# Patient Record
Sex: Male | Born: 1990 | Race: Black or African American | Hispanic: No | Smoking: Current every day smoker
Health system: Southern US, Community
[De-identification: ages and names within clinical notes are randomized; demographics above are authoritative.]

## PROBLEM LIST (undated history)

## (undated) DIAGNOSIS — F319 Bipolar disorder, unspecified: Secondary | ICD-10-CM

## (undated) HISTORY — PX: TONSILLECTOMY: SUR1361

---

## 2014-10-11 ENCOUNTER — Emergency Department: Payer: Self-pay | Admitting: Emergency Medicine

## 2015-01-20 ENCOUNTER — Emergency Department
Admission: EM | Admit: 2015-01-20 | Discharge: 2015-01-21 | Disposition: A | Discharge status: Home / Self Care | Payer: Self-pay

## 2015-05-07 ENCOUNTER — Emergency Department
Admission: EM | Admit: 2015-05-07 | Discharge: 2015-05-07 | Disposition: A | Discharge status: Home / Self Care | Payer: Self-pay | Attending: Emergency Medicine | Admitting: Emergency Medicine

## 2015-05-07 ENCOUNTER — Encounter: Payer: Self-pay | Admitting: Emergency Medicine

## 2015-05-07 DIAGNOSIS — S6991XD Unspecified injury of right wrist, hand and finger(s), subsequent encounter: Secondary | ICD-10-CM | POA: Insufficient documentation

## 2015-05-07 DIAGNOSIS — S39012D Strain of muscle, fascia and tendon of lower back, subsequent encounter: Secondary | ICD-10-CM | POA: Insufficient documentation

## 2015-05-07 DIAGNOSIS — Z880 Allergy status to penicillin: Secondary | ICD-10-CM | POA: Insufficient documentation

## 2015-05-07 DIAGNOSIS — S161XXD Strain of muscle, fascia and tendon at neck level, subsequent encounter: Secondary | ICD-10-CM | POA: Insufficient documentation

## 2015-05-07 DIAGNOSIS — Z720 Tobacco use: Secondary | ICD-10-CM | POA: Insufficient documentation

## 2015-05-07 MED ORDER — IBUPROFEN 800 MG PO TABS: 800.0000 mg | ORAL_TABLET | Freq: Three times a day (TID) | ORAL | Status: DC | PRN

## 2015-05-07 MED ORDER — TRAMADOL HCL 50 MG PO TABS
50.0000 mg | ORAL_TABLET | Freq: Once | ORAL | Status: AC
  Administered 2015-05-07: 50 mg via ORAL
  Filled 2015-05-07: qty 1

## 2015-05-07 MED ORDER — TRAMADOL HCL 50 MG PO TABS: 50.0000 mg | ORAL_TABLET | Freq: Four times a day (QID) | ORAL | Status: DC | PRN

## 2015-05-07 MED ORDER — CYCLOBENZAPRINE HCL 10 MG PO TABS
10.0000 mg | ORAL_TABLET | Freq: Once | ORAL | Status: AC
  Administered 2015-05-07: 10 mg via ORAL
  Filled 2015-05-07: qty 1

## 2015-05-07 MED ORDER — IBUPROFEN 800 MG PO TABS
800.0000 mg | ORAL_TABLET | Freq: Once | ORAL | Status: AC
  Administered 2015-05-07: 800 mg via ORAL
  Filled 2015-05-07: qty 1

## 2015-05-07 MED ORDER — CYCLOBENZAPRINE HCL 10 MG PO TABS: 10.0000 mg | ORAL_TABLET | Freq: Three times a day (TID) | ORAL | Status: DC | PRN
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
## 2015-05-07 NOTE — Discharge Instructions (Signed)
Take medications as directed and follow up with scheduled appointment.

## 2015-09-22 ENCOUNTER — Ambulatory Visit
Admission: EM | Admit: 2015-09-22 | Discharge: 2015-09-22 | Disposition: A | Discharge status: Home / Self Care | Payer: Self-pay | Attending: Family Medicine | Admitting: Family Medicine

## 2015-09-22 DIAGNOSIS — R509 Fever, unspecified: Secondary | ICD-10-CM

## 2015-09-22 DIAGNOSIS — J020 Streptococcal pharyngitis: Secondary | ICD-10-CM

## 2015-09-22 HISTORY — DX: Bipolar disorder, unspecified: F31.9

## 2015-09-22 LAB — RAPID STREP SCREEN (MED CTR MEBANE ONLY): Streptococcus, Group A Screen (Direct): POSITIVE — AB

## 2015-09-22 MED ORDER — AZITHROMYCIN 250 MG PO TABS: ORAL_TABLET | ORAL | Status: DC

## 2015-09-22 NOTE — ED Notes (Signed)
Started 2 days ago with headache and abdominal pain with "eyes burning". Slight sore throat. Vomited x3 this morning

## 2015-09-22 NOTE — Discharge Instructions (Signed)
Laryngitis Laryngitis is swelling (inflammation) of your vocal cords. This causes hoarseness, coughing, loss of voice, sore throat, or a dry throat. When your vocal cords are inflamed, your voice sounds different. Laryngitis can be temporary (acute) or long-term (chronic). Most cases of acute laryngitis improve with time. Chronic laryngitis is laryngitis that lasts for more than three weeks. HOME CARE  Drink enough fluid to keep your pee (urine) clear or pale yellow.  Breathe in moist air. Use a humidifier if you live in a dry climate.  Take medicines only as told by your doctor.  Do not smoke cigarettes or electronic cigarettes. If you need help quitting, ask your doctor.  Talk as little as possible. Also avoid whispering, which can cause vocal strain.  Write instead of talking. Do this until your voice is back to normal. GET HELP IF:  You have a fever.  Your pain is worse.  You have trouble swallowing. GET HELP RIGHT AWAY IF:  You cough up blood.  You have trouble breathing.   This information is not intended to replace advice given to you by your health care provider. Make sure you discuss any questions you have with your health care provider.   Document Released: 08/01/2011 Document Revised: 09/02/2014 Document Reviewed: 01/25/2014 Elsevier Interactive Patient Education 2016 [employer]  Pharyngitis Pharyngitis is a sore throat (pharynx). There is redness, pain, and swelling of your throat. HOME CARE   Drink enough fluids to keep your pee (urine) clear or pale yellow.  Only take medicine as told by your doctor.  You may get sick again if you do not take medicine as told. Finish your medicines, even if you start to feel better.  Do not take aspirin.  Rest.  Rinse your mouth (gargle) with salt water ( tsp of salt per 1 qt of water) every 1-2 hours. This will help the pain.  If you are not at risk for choking, you can suck on hard candy or sore throat  lozenges. GET HELP IF:  You have large, tender lumps on your neck.  You have a rash.  You cough up green, yellow-brown, or bloody spit. GET HELP RIGHT AWAY IF:   You have a stiff neck.  You drool or cannot swallow liquids.  You throw up (vomit) or are not able to keep medicine or liquids down.  You have very bad pain that does not go away with medicine.  You have problems breathing (not from a stuffy nose). MAKE SURE YOU:   Understand these instructions.  Will watch your condition.  Will get help right away if you are not doing well or get worse.   This information is not intended to replace advice given to you by your health care provider. Make sure you discuss any questions you have with your health care provider.   Document Released: 01/29/2008 Document Revised: 06/02/2013 Document Reviewed: 04/19/2013 Elsevier Interactive Patient Education 2016 Elsevier Inc.  Strep Throat Strep throat is an infection of the throat. It is caused by germs. Strep throat spreads from person to person because of coughing, sneezing, or close contact. HOME CARE Medicines  Take over-the-counter and prescription medicines only as told by your doctor.  Take your antibiotic medicine as told by your doctor. Do not stop taking the medicine even if you feel better.  Have family members who also have a sore throat or fever go to a doctor. Eating and Drinking  Do not share food, drinking cups, or personal items.  Try eating  soft foods until your sore throat feels better.  Drink enough fluid to keep your pee (urine) clear or pale yellow. General Instructions  Rinse your mouth (gargle) with a salt-water mixture 3-4 times per day or as needed. To make a salt-water mixture, stir -1 tsp of salt into 1 cup of warm water.  Make sure that all people in your house wash their hands well.  Rest.  Stay home from school or work until you have been taking antibiotics for 24 hours.  Keep all  follow-up visits as told by your doctor. This is important. GET HELP IF:  Your neck keeps getting bigger.  You get a rash, cough, or earache.  You cough up thick liquid that is green, yellow-brown, or bloody.  You have pain that does not get better with medicine.  Your problems get worse instead of getting better.  You have a fever. GET HELP RIGHT AWAY IF:  You throw up (vomit).  You get a very bad headache.  You neck hurts or it feels stiff.  You have chest pain or you are short of breath.  You have drooling, very bad throat pain, or changes in your voice.  Your neck is swollen or the skin gets red and tender.  Your mouth is dry or you are peeing less than normal.  You keep feeling more tired or it is hard to wake up.  Your joints are red or they hurt.   This information is not intended to replace advice given to you by your health care provider. Make sure you discuss any questions you have with your health care provider.   Document Released: 01/29/2008 Document Revised: 05/03/2015 Document Reviewed: 12/05/2014 Elsevier Interactive Patient Education [employer]

## 2015-09-22 NOTE — ED Provider Notes (Signed)
CSN: [id number]     Arrival date & time 09/22/15  1556 History   First MD Initiated Contact with Patient 09/22/15 1753     Nurses notes were reviewed.   Chief Complaint  Patient presents with  . Headache    Patient states she was sick now for about 3-4 days. Along with having a headache he has had a sore throat nasal congestion coughing and pressure behind his eyes. States that he doesn't that he can have strep (tonsils out. Until Monday he was fine. He also reports having fever at home as well. Patient used to smoke according to his significant other she made him stopped about 3 or 4 months ago. No significant family medical history diminutive be aware of it is evident history of bipolar disease and has had tonsillectomy for the past.   (Consider location/radiation/quality/duration/timing/severity/associated sxs/prior Treatment) Patient is a 25 y.o. male presenting with pharyngitis. The history is provided by a significant other. No language interpreter was used.  Sore Throat This is a new problem. The current episode started more than 2 days ago. The problem occurs constantly. The problem has not changed since onset.Associated symptoms include headaches. Pertinent negatives include no chest pain, no abdominal pain and no shortness of breath. Nothing aggravates the symptoms. Nothing relieves the symptoms. He has tried nothing for the symptoms. The treatment provided no relief.    Past Medical History  Diagnosis Date  . Bipolar 1 disorder [facility])    Past Surgical History  Procedure Laterality Date  . Tonsillectomy     History reviewed. No pertinent family history. Social History  Substance Use Topics  . Smoking status: Former Games developer  . Smokeless tobacco: None  . Alcohol Use: No    Review of Systems  Respiratory: Negative for shortness of breath.   Cardiovascular: Negative for chest pain.  Gastrointestinal: Negative for abdominal pain.  Neurological: Positive for headaches.     Allergies  Penicillins  Home Medications   Prior to Admission medications   Medication Sig Start Date End Date Taking? Authorizing Provider  azithromycin (ZITHROMAX Z-PAK) 250 MG tablet Take 2 tablets first day and then 1 po a day for 4 days 09/22/15   Hassan Rowan, MD  cyclobenzaprine (FLEXERIL) 10 MG tablet Take 1 tablet (10 mg total) by mouth every 8 (eight) hours as needed for muscle spasms. 05/07/15   Joni Reining, PA-C  ibuprofen (ADVIL,MOTRIN) 800 MG tablet Take 1 tablet (800 mg total) by mouth every 8 (eight) hours as needed for moderate pain. 05/07/15   Joni Reining, PA-C  traMADol (ULTRAM) 50 MG tablet Take 1 tablet (50 mg total) by mouth every 6 (six) hours as needed for moderate pain. 05/07/15   Joni Reining, PA-C   Meds Ordered and Administered this Visit  Medications - No data to display  BP 122/67 mmHg  Pulse 69  Temp(Src) 97.2 F (36.2 C) (Tympanic)  Resp 16  Ht  (1.727 m)  Wt 160 lb (72.576 kg)  BMI 24.33 kg/m2  SpO2 99% No data found.   Physical Exam  Constitutional: He appears well-developed and well-nourished.  HENT:  Head: Normocephalic and atraumatic.  Right Ear: Hearing, tympanic membrane, external ear and ear canal normal.  Left Ear: Hearing, tympanic membrane, external ear and ear canal normal.  Nose: Mucosal edema present.  Mouth/Throat: No oral lesions. Normal dentition. No dental caries. Posterior oropharyngeal erythema present.  Cardiovascular: Normal rate, regular rhythm and normal heart sounds.   Pulmonary/Chest: Effort normal.  Vitals reviewed.   ED Course  Procedures (including critical care time)  Labs Review Labs Reviewed  RAPID STREP SCREEN (NOT AT [facility]) - Abnormal; Notable for the following:    Streptococcus, Group A Screen (Direct) POSITIVE (*)    All other components within normal limits    Imaging Review No results found.   Visual Acuity Review  Right Eye Distance:   Left Eye Distance:   Bilateral Distance:     Right Eye Near:   Left Eye Near:    Bilateral Near:         MDM   1. Acute streptococcal pharyngitis   2. Other specified fever    patient informed he has strep. Because he is allergic to penicillin we'll place him on Z-Pak. Strongly suggest that he have a repeat strep test in 2-3 weeks for proof of cure. Also work note for today and tomorrow given as well.   Hassan Rowan, MD 09/22/15 [phone]

## 2015-12-20 ENCOUNTER — Ambulatory Visit
Admission: EM | Admit: 2015-12-20 | Discharge: 2015-12-20 | Disposition: A | Discharge status: Home / Self Care | Payer: No Typology Code available for payment source | Attending: Family Medicine | Admitting: Family Medicine

## 2015-12-20 ENCOUNTER — Encounter: Payer: Self-pay | Admitting: Emergency Medicine

## 2015-12-20 DIAGNOSIS — H6503 Acute serous otitis media, bilateral: Secondary | ICD-10-CM

## 2015-12-20 LAB — RAPID STREP SCREEN (MED CTR MEBANE ONLY): Streptococcus, Group A Screen (Direct): NEGATIVE

## 2015-12-20 MED ORDER — AZITHROMYCIN 250 MG PO TABS: ORAL_TABLET | ORAL | Status: DC

## 2015-12-20 NOTE — ED Provider Notes (Signed)
CSN: [id number]     Arrival date & time 12/20/15  1005 History   First MD Initiated Contact with Patient 12/20/15 1039     Chief Complaint  Patient presents with  . Fever   (Consider location/radiation/quality/duration/timing/severity/associated sxs/prior Treatment) Patient is a 25 y.o. male presenting with URI. The history is provided by the patient.  URI Presenting symptoms: congestion, cough, ear pain and sore throat   Severity:  Moderate Onset quality:  Sudden Duration:  4 days Timing:  Constant Progression:  Worsening Chronicity:  New Relieved by:  None tried Associated symptoms: headaches   Associated symptoms: no wheezing   Risk factors: sick contacts   Risk factors: not elderly, no chronic cardiac disease, no chronic kidney disease, no chronic respiratory disease, no diabetes mellitus, no immunosuppression, no recent illness and no recent travel     Past Medical History  Diagnosis Date  . Bipolar 1 disorder [facility])    Past Surgical History  Procedure Laterality Date  . Tonsillectomy     History reviewed. No pertinent family history. Social History  Substance Use Topics  . Smoking status: Former Games developer  . Smokeless tobacco: None  . Alcohol Use: No    Review of Systems  HENT: Positive for congestion, ear pain and sore throat.   Respiratory: Positive for cough. Negative for wheezing.   Neurological: Positive for headaches.    Allergies  Penicillins  Home Medications   Prior to Admission medications   Medication Sig Start Date End Date Taking? Authorizing Provider  azithromycin (ZITHROMAX Z-PAK) 250 MG tablet 2 tabs po once day 1, then 1 tab po qd for next 4 days 12/20/15   Payton Mccallum, MD  cyclobenzaprine (FLEXERIL) 10 MG tablet Take 1 tablet (10 mg total) by mouth every 8 (eight) hours as needed for muscle spasms. 05/07/15   Joni Reining, PA-C  ibuprofen (ADVIL,MOTRIN) 800 MG tablet Take 1 tablet (800 mg total) by mouth every 8 (eight) hours as needed for  moderate pain. 05/07/15   Joni Reining, PA-C  traMADol (ULTRAM) 50 MG tablet Take 1 tablet (50 mg total) by mouth every 6 (six) hours as needed for moderate pain. 05/07/15   Joni Reining, PA-C   Meds Ordered and Administered this Visit  Medications - No data to display  BP 128/76 mmHg  Pulse 77  Temp(Src) 98.5 F (36.9 C) (Tympanic)  Resp 16  SpO2 99% No data found.   Physical Exam  Constitutional: He appears well-developed and well-nourished. No distress.  HENT:  Head: Normocephalic and atraumatic.  Right Ear: External ear and ear canal normal. Tympanic membrane is erythematous and bulging. A middle ear effusion is present.  Left Ear: External ear and ear canal normal. Tympanic membrane is erythematous and bulging. A middle ear effusion is present.  Nose: Nose normal.  Mouth/Throat: Uvula is midline, oropharynx is clear and moist and mucous membranes are normal. No oropharyngeal exudate or tonsillar abscesses.  Eyes: Conjunctivae and EOM are normal. Pupils are equal, round, and reactive to light. Right eye exhibits no discharge. Left eye exhibits no discharge. No scleral icterus.  Neck: Normal range of motion. Neck supple. No tracheal deviation present. No thyromegaly present.  Cardiovascular: Normal rate, regular rhythm and normal heart sounds.   Pulmonary/Chest: Effort normal and breath sounds normal. No stridor. No respiratory distress. He has no wheezes. He has no rales. He exhibits no tenderness.  Lymphadenopathy:    He has no cervical adenopathy.  Neurological: He is alert.  Skin:  Skin is warm and dry. No rash noted. He is not diaphoretic.  Nursing note and vitals reviewed.   ED Course  Procedures (including critical care time)  Labs Review Labs Reviewed  RAPID STREP SCREEN (NOT AT [facility])  CULTURE, GROUP A STREP [facility]    Imaging Review No results found.   Visual Acuity Review  Right Eye Distance:   Left Eye Distance:   Bilateral Distance:    Right Eye  Near:   Left Eye Near:    Bilateral Near:         MDM   1. Bilateral acute serous otitis media, recurrence not specified    Discharge Medication List as of 12/20/2015 11:21 AM     1. Labs/x-ray results and diagnosis reviewed with patient/parent/guardian/family 2. rx as per orders above; reviewed possible side effects, interactions, risks and benefits; rx for zithromax as per orders  3. Recommend supportive treatment with otc analgesics prn 4. Follow-up prn if symptoms worsen or don't improve    Payton Mccallumrlando Areebah Meinders, MD 12/20/15 1253

## 2015-12-20 NOTE — ED Notes (Signed)
Patient c/o sore throat, cough, HAs and fever since Monday.

## 2015-12-22 LAB — CULTURE, GROUP A STREP (THRC)

## 2016-03-21 ENCOUNTER — Ambulatory Visit
Admission: EM | Admit: 2016-03-21 | Discharge: 2016-03-21 | Disposition: A | Discharge status: Home / Self Care | Payer: Self-pay | Attending: Family Medicine | Admitting: Family Medicine

## 2016-03-21 ENCOUNTER — Encounter: Payer: Self-pay | Admitting: Emergency Medicine

## 2016-03-21 DIAGNOSIS — Z202 Contact with and (suspected) exposure to infections with a predominantly sexual mode of transmission: Secondary | ICD-10-CM

## 2016-03-21 DIAGNOSIS — Z113 Encounter for screening for infections with a predominantly sexual mode of transmission: Secondary | ICD-10-CM

## 2016-03-21 LAB — CHLAMYDIA/NGC RT PCR (ARMC ONLY)
Chlamydia Tr: NOT DETECTED
N gonorrhoeae: NOT DETECTED

## 2016-03-21 MED ORDER — AZITHROMYCIN 500 MG PO TABS
1000.0000 mg | ORAL_TABLET | Freq: Once | ORAL | Status: AC
  Administered 2016-03-21: 1000 mg via ORAL

## 2016-03-21 NOTE — ED Provider Notes (Signed)
[facility] ____________________________________________  Time seen: Approximately 12:10 PM  I have reviewed the triage vital signs and the nursing notes.   HISTORY  Chief Complaint Exposure to STD  HPI Darren Grant is a 25 y.o. male presents for request of testing and treatment of chlamydia. Patient reports that his wife told him last night that she tested positive for chlamydia and is being treated. Patient states that she was tested for "all other " STDs and was negative but was positive for chlamydia. Patient reports that she is his only sexual partner in the last 7 years. Patient declines concerns for any other STDs. Patient further states that he only wants to be tested for gonorrhea and chlamydia. Patient declines testing for other STDs including HIV, herpes, syphilis or hepatitis.  Patient declines any symptoms. Patient states that he feels fine. Patient declines any dysuria, urinary changes, penile or testicular pain or swelling, penile or testicular drainage or exudate, abdominal pain, back pain, rashes, fevers, weakness or recent sickness. Patient states he's never had STDs before, but he states about 2 months ago he was tested for STDs and was negative.   Past Medical History:  Diagnosis Date  . Bipolar 1 disorder (HCC)     There are no active problems to display for this patient.   Past Surgical History:  Procedure Laterality Date  . TONSILLECTOMY      Current Outpatient Rx  . Order #: [id number] Class: Print    No current facility-administered medications for this encounter.   Current Outpatient Prescriptions:  .  ibuprofen (ADVIL,MOTRIN) 800 MG tablet, Take 1 tablet (800 mg total) by mouth every 8 (eight) hours as needed for moderate pain., Disp: 15 tablet, Rfl: 0  Allergies Penicillins Reports anaphylaxis with penicillins. History reviewed. No pertinent family history.  Social History Social History  Substance Use Topics  . Smoking status:  Former Games developer  . Smokeless tobacco: Never Used  . Alcohol use No    Review of Systems Constitutional: No fever/chills Eyes: No visual changes. ENT: No sore throat. Cardiovascular: Denies chest pain. Respiratory: Denies shortness of breath. Gastrointestinal: No abdominal pain.  No nausea, no vomiting.  No diarrhea.  No constipation. Genitourinary: Negative for dysuria. Musculoskeletal: Negative for back pain. Skin: Negative for rash. Neurological: Negative for headaches, focal weakness or numbness.  10-point ROS otherwise negative.  ____________________________________________   PHYSICAL EXAM:  VITAL SIGNS: ED Triage Vitals  Enc Vitals Group     BP 03/21/16 1147 123/69     Pulse Rate 03/21/16 1147 62     Resp 03/21/16 1147 16     Temp 03/21/16 1147 98.2 F (36.8 C)     Temp Source 03/21/16 1147 Tympanic     SpO2 03/21/16 1147 100 %     Weight --      Height 03/21/16 1147  (1.727 m)     Head Circumference --      Peak Flow --      Pain Score 03/21/16 1149 0     Pain Loc --      Pain Edu? --      Excl. in GC? --     Constitutional: Alert and oriented. Well appearing and in no acute distress. Eyes: Conjunctivae are normal. PERRL. EOMI. ENT      Head: Normocephalic and atraumatic.      Nose: No congestion/rhinnorhea.      Mouth/Throat: Mucous membranes are moist.Oropharynx non-erythematous. Neck: No stridor. Supple without meningismus.  Hematological/Lymphatic/Immunilogical: No cervical lymphadenopathy. Cardiovascular:  Normal rate, regular rhythm. Grossly normal heart sounds.  Good peripheral circulation. Respiratory: Normal respiratory effort without tachypnea nor retractions. Breath sounds are clear and equal bilaterally. No wheezes/rales/rhonchi.. Gastrointestinal: Soft and nontender. No distention. Normal Bowel sounds. No CVA tenderness. Male: Patient declined penile and testicular exam. Musculoskeletal:  Nontender with normal range of motion in all  extremities. No midline cervical, thoracic or lumbar tenderness to palpation.    Neurologic:  Normal speech and language. No gross focal neurologic deficits are appreciated. Speech is normal. No gait instability.  Skin:  Skin is warm, dry and intact. No rash noted. Psychiatric: Mood and affect are normal. Speech and behavior are normal. Patient exhibits appropriate insight and judgment   ___________________________________________   LABS (all labs ordered are listed, but only abnormal results are displayed)  Labs Reviewed  CHLAMYDIA/NGC RT PCR ([facility] ONLY)   ____________________________________________   PROCEDURES Procedures  _________________________________________   INITIAL IMPRESSION / ASSESSMENT AND PLAN / ED COURSE  Pertinent labs & imaging results that were available during my care of the patient were reviewed by me and considered in my medical decision making (see chart for details).discussed patient and plan of care with Dr Thurmond Butts who agrees with plan.   Very well-appearing patient. No acute distress. Presenting for testing and treatment of chlamydia. Patient expresses concern for possible STD exposure. Patient declines testing of any other STDs. Patient denies any symptoms. Patient states repetitively that he only wants to be tested for gonorrhea and chlamydia. Patient declined male exam. Discussed in detail with patient we will evaluate for gonorrhea and chlamydia, and we'll go ahead and treat patient for chlamydia with 1 g azithromycin oral 1 in urgent care.     Discussed with patient regarding his penicillin allergy and he states when he takes PCN "I die." Patient states he is unsure as he if he has ever taken any cephalosporins in the past. As patient has had possible Chlamydia exposure will treat for chlamydia. Discussed with patient, often prophylactically treat for gonorrhea as well but as patient anaphylaxis with penicillin and unsure if he's taken  cephalosporins in the past, with out known gonorrhea exposure, will await testing results. Discussed with patient no sexual activity until followed up to ensure clearance. Patient reports that he'll follow-up with his primary in one week. Also given information for [facility] and encouraged close follow up.   Discussed follow up with Primary care physician this week. Discussed follow up and return parameters including no resolution or any worsening concerns. Patient verbalized understanding and agreed to plan.   ____________________________________________   FINAL CLINICAL IMPRESSION(S) / ED DIAGNOSES  Final diagnoses:  STD exposure  Screen for STD (sexually transmitted disease)     Discharge Medication List as of 03/21/2016 12:30 PM      Note: This dictation was prepared with Dragon dictation along with smaller phrase technology. Any transcriptional errors that result from this process are unintentional.    Clinical Course      Renford Dills, NP 03/21/16 1412

## 2016-03-21 NOTE — ED Triage Notes (Signed)
Patient states that his wife told him yesterday that she has chlamydia and is being treated for this.  Patient states that he would like to be tested for this and treated.  Patient denies penile discharge or pain.

## 2016-03-21 NOTE — Discharge Instructions (Signed)
Take medication as prescribed. Practice safe sex. No sexual activity until follow up.   Follow up with your primary care physician or [place] [facility] prior to resuming sexual activity.   Follow up with your primary care physician this week as needed. Return to Urgent care for new or worsening concerns.

## 2016-04-12 ENCOUNTER — Encounter: Payer: Self-pay | Admitting: Emergency Medicine

## 2016-04-12 ENCOUNTER — Emergency Department: Payer: Self-pay

## 2016-04-12 ENCOUNTER — Emergency Department
Admission: EM | Admit: 2016-04-12 | Discharge: 2016-04-12 | Disposition: A | Discharge status: Home / Self Care | Payer: Self-pay | Attending: Emergency Medicine | Admitting: Emergency Medicine

## 2016-04-12 ENCOUNTER — Ambulatory Visit
Admission: EM | Admit: 2016-04-12 | Discharge: 2016-04-12 | Payer: Self-pay | Attending: Family Medicine | Admitting: Family Medicine

## 2016-04-12 DIAGNOSIS — R0781 Pleurodynia: Secondary | ICD-10-CM | POA: Insufficient documentation

## 2016-04-12 DIAGNOSIS — F17200 Nicotine dependence, unspecified, uncomplicated: Secondary | ICD-10-CM | POA: Insufficient documentation

## 2016-04-12 DIAGNOSIS — F319 Bipolar disorder, unspecified: Secondary | ICD-10-CM | POA: Insufficient documentation

## 2016-04-12 DIAGNOSIS — F17210 Nicotine dependence, cigarettes, uncomplicated: Secondary | ICD-10-CM | POA: Insufficient documentation

## 2016-04-12 DIAGNOSIS — R0789 Other chest pain: Secondary | ICD-10-CM

## 2016-04-12 DIAGNOSIS — Z880 Allergy status to penicillin: Secondary | ICD-10-CM | POA: Insufficient documentation

## 2016-04-12 LAB — CBC
HEMATOCRIT: 51.3 % (ref 40.0–52.0)
HEMOGLOBIN: 17.6 g/dL (ref 13.0–18.0)
MCH: 31.4 pg (ref 26.0–34.0)
MCHC: 34.3 g/dL (ref 32.0–36.0)
MCV: 91.5 fL (ref 80.0–100.0)
Platelets: 259 10*3/uL (ref 150–440)
RBC: 5.6 MIL/uL (ref 4.40–5.90)
RDW: 13.1 % (ref 11.5–14.5)
WBC: 8.3 10*3/uL (ref 3.8–10.6)

## 2016-04-12 LAB — BASIC METABOLIC PANEL
Anion gap: 8 (ref 5–15)
BUN: 9 mg/dL (ref 6–20)
CHLORIDE: 106 mmol/L (ref 101–111)
CO2: 26 mmol/L (ref 22–32)
Calcium: 9.3 mg/dL (ref 8.9–10.3)
Creatinine, Ser: 0.7 mg/dL (ref 0.61–1.24)
GFR calc non Af Amer: >60 mL/min (ref >60)
Glucose, Bld: 128 mg/dL — ABNORMAL HIGH (ref 65–99)
POTASSIUM: 3.5 mmol/L (ref 3.5–5.1)
SODIUM: 140 mmol/L (ref 135–145)

## 2016-04-12 LAB — TROPONIN I: Troponin I: <0.03 ng/mL (ref <0.03)

## 2016-04-12 MED ORDER — ASPIRIN 81 MG PO CHEW
324.0000 mg | CHEWABLE_TABLET | Freq: Once | ORAL | Status: AC
  Administered 2016-04-12: 324 mg via ORAL

## 2016-04-12 MED ORDER — ASPIRIN 81 MG PO CHEW
CHEWABLE_TABLET | ORAL | Status: AC
  Administered 2016-04-12: 324 mg via ORAL
  Filled 2016-04-12: qty 4

## 2016-04-12 NOTE — ED Provider Notes (Signed)
[facility] Emergency Department Provider Note  ____________________________________________   First MD Initiated Contact with Patient 04/12/16 2031     (approximate)  I have reviewed the triage vital signs and the nursing notes.   HISTORY  Chief Complaint Chest Pain    HPI Darren Grant is a 25 y.o. male with no significant past medical history who presents for evaluation of chest pain 3 weeks.  He states that his present in the left side of his chest and has been constant for 3 weeks.  Movement, outpatient, and exertion makes it worse and nothing makes it better.  He describes it as anywhere from moderate to severe in intensity.  He has no shortness of breath, fever/chills, abdominal pain, nausea, vomiting, diarrhea.He has had similar symptoms in the past and states that he was worked up but nobody is able to figure out what was wrong with him.  He went to an urgent care today and was evaluated by Dr. Judd Gaudieronty who noted reproducible chest wall tenderness but also was concerned about some EKG changes and sent him to the emergency department for further evaluation.   Past Medical History:  Diagnosis Date  . Bipolar 1 disorder (HCC)     There are no active problems to display for this patient.   Past Surgical History:  Procedure Laterality Date  . TONSILLECTOMY      Prior to Admission medications   Not on File    Allergies Penicillins  No family history on file.  Social History Social History  Substance Use Topics  . Smoking status: Current Every Day Smoker    Packs/day: 1.00    Years: 14.00  . Smokeless tobacco: Never Used  . Alcohol use Yes    Review of Systems Constitutional: No fever/chills Eyes: No visual changes. ENT: No sore throat. Cardiovascular: +chest pain. Respiratory: Denies shortness of breath. Gastrointestinal: No abdominal pain.  No nausea, no vomiting.  No diarrhea.  No constipation. Genitourinary: Negative for  dysuria. Musculoskeletal: Negative for back pain. Skin: Negative for rash. Neurological: Negative for headaches, focal weakness or numbness.  10-point ROS otherwise negative.  ____________________________________________   PHYSICAL EXAM:  VITAL SIGNS: ED Triage Vitals  Enc Vitals Group     BP 04/12/16 1735 136/79     Pulse Rate 04/12/16 1735 79     Resp 04/12/16 1735 18     Temp 04/12/16 1735 98.4 F (36.9 C)     Temp Source 04/12/16 1735 Oral     SpO2 04/12/16 1735 99 %     Weight 04/12/16 1736 160 lb (72.6 kg)     Height 04/12/16 1736 5\' 8"  (1.727 m)     Head Circumference --      Peak Flow --      Pain Score 04/12/16 1736 7     Pain Loc --      Pain Edu? --      Excl. in GC? --     Constitutional: Alert and oriented. Well appearing and in no acute distress. Eyes: Conjunctivae are normal. PERRL. EOMI. Head: Atraumatic. Nose: No congestion/rhinnorhea. Mouth/Throat: Mucous membranes are moist.  Oropharynx non-erythematous. Neck: No stridor.  No meningeal signs.   Cardiovascular: Normal rate, regular rhythm. Good peripheral circulation. No rub, grossly normal heart sounds. Highly reproducible severe left-sided chest wall tenderness to palpation. Respiratory: Normal respiratory effort.  No retractions. Lungs CTAB. Gastrointestinal: Soft and nontender. No distention.  Musculoskeletal: No lower extremity tenderness nor edema. No gross deformities of extremities. Neurologic:  Normal speech and language. No gross focal neurologic deficits are appreciated.  Skin:  Skin is warm, dry and intact. No rash noted. Psychiatric: Mood and affect are normal. Speech and behavior are normal.  ____________________________________________   LABS (all labs ordered are listed, but only abnormal results are displayed)  Labs Reviewed  BASIC METABOLIC PANEL - Abnormal; Notable for the following:       Result Value   Glucose, Bld 128 (*)    All other components within normal limits  CBC    TROPONIN I   ____________________________________________  EKG  ED ECG REPORT I, Darris Staiger, the attending physician, personally viewed and interpreted this ECG.   Date: 04/12/2016  EKG Time: 17:39  Rate: 73  Rhythm: normal sinus rhythm  Axis: Normal  Intervals:left anterior fascicular block  ST&T Change: Minimal ST elevation in leads V2 and V3 consistent with early repolarization.  Inverted T-wave in leads 2 and 3.  No significant change when compared to EKG from 10/11/2014  ____________________________________________  RADIOLOGY   Dg Chest 2 View  Result Date: 04/12/2016 CLINICAL DATA:  Initial valuation for acute chest pain. EXAM: CHEST  2 VIEW COMPARISON:  None. FINDINGS: The heart size and mediastinal contours are within normal limits. Both lungs are clear. The visualized skeletal structures are unremarkable. IMPRESSION: No active cardiopulmonary disease. Electronically Signed   By: Rise MuBenjamin  McClintock M.D.   On: 04/12/2016 17:56    ____________________________________________   PROCEDURES  Procedure(s) performed:   Procedures   Critical Care performed: No ____________________________________________   INITIAL IMPRESSION / ASSESSMENT AND PLAN / ED COURSE  Pertinent labs & imaging results that were available during my care of the patient were reviewed by me and considered in my medical decision making (see chart for details).  Patient is very well-appearing and in no acute distress with normal labs and an EKG that to my interpretation has not changed in about 18 months.  I am not concerned at this time for myocarditis or pericarditis.  He has highly reproducible chest wall tenderness most consistent with costochondritis.  The fact that he has been having these symptoms for 3 weeks is also actually reassuring rather than worrisome.  Since this is not the first time that he has had the symptoms I am encouraging him to follow up with a cardiologist.  I also  recommend that he take ibuprofen 600 mg 3 times a day with meals for 5-7 days to see if this helps with his discomfort; this should help with costochondritis as well as a low possibility of pericarditis.  He understands and agrees with the plan.  I gave my usual and customary return precautions.      ____________________________________________  FINAL CLINICAL IMPRESSION(S) / ED DIAGNOSES  Final diagnoses:  Acute chest wall pain     MEDICATIONS GIVEN DURING THIS VISIT:  Medications  aspirin chewable tablet 324 mg      NEW OUTPATIENT MEDICATIONS STARTED DURING THIS VISIT:  Current Discharge Medication List        Note:  This document was prepared using Dragon voice recognition software and may include unintentional dictation errors.    Loleta Roseory Abron Neddo, MD 04/12/16 2051

## 2016-04-12 NOTE — ED Provider Notes (Signed)
[facility]    CSN: [id number] Arrival date & time: 04/12/16  1533  First Provider Contact:  None       History   Chief Complaint Chief Complaint  Patient presents with  . Chest Pain    HPI Darren Grant is a 25 y.o. male.   25 yo male with a c/o 3 weeks of progressively worsening sharp, chest pain, worse with deep inspiration, positioning and associated with shortness of breath.  Denies any fevers, chills, cough, injuries, wheezing. States about 3 years ago had similar symptoms and was told he had "fluid around the heart". Patient also complains of intermittent palpitations.    The history is provided by the patient.    Past Medical History:  Diagnosis Date  . Bipolar 1 disorder (HCC)     There are no active problems to display for this patient.   Past Surgical History:  Procedure Laterality Date  . TONSILLECTOMY         Home Medications    Prior to Admission medications   Medication Sig Start Date End Date Taking? Authorizing Provider  ibuprofen (ADVIL,MOTRIN) 800 MG tablet Take 1 tablet (800 mg total) by mouth every 8 (eight) hours as needed for moderate pain. 05/07/15   Joni Reiningonald K Smith, PA-C    Family History History reviewed. No pertinent family history.  Social History Social History  Substance Use Topics  . Smoking status: Current Every Day Smoker    Packs/day: 1.00    Years: 14.00  . Smokeless tobacco: Never Used  . Alcohol use Yes     Allergies   Penicillins   Review of Systems Review of Systems   Physical Exam Triage Vital Signs ED Triage Vitals  Enc Vitals Group     BP 04/12/16 1545 126/74     Pulse Rate 04/12/16 1545 70     Resp 04/12/16 1545 16     Temp 04/12/16 1545 98.4 F (36.9 C)     Temp Source 04/12/16 1545 Oral     SpO2 04/12/16 1545 100 %     Weight --      Height 04/12/16 1545 5\' 8"  (1.727 m)     Head Circumference --      Peak Flow --      Pain Score 04/12/16 1547 7     Pain Loc --      Pain Edu?  --      Excl. in GC? --    No data found.   Updated Vital Signs BP 126/74 (BP Location: Left Arm)   Pulse 70   Temp 98.4 F (36.9 C) (Oral)   Resp 16   Ht 5\' 8"  (1.727 m)   SpO2 100%   Visual Acuity Right Eye Distance:   Left Eye Distance:   Bilateral Distance:    Right Eye Near:   Left Eye Near:    Bilateral Near:     Physical Exam  Constitutional: He is oriented to person, place, and time. He appears well-developed and well-nourished. No distress.  HENT:  Head: Normocephalic and atraumatic.  Cardiovascular: Normal rate, regular rhythm, normal heart sounds and intact distal pulses.   No murmur heard. Pulmonary/Chest: Effort normal and breath sounds normal. No respiratory distress. He has no wheezes. He has no rales. He exhibits tenderness (to left upper chest wall).  Abdominal: Soft. Bowel sounds are normal. He exhibits no distension and no mass. There is no tenderness. There is no rebound and no guarding.  Neurological: He is  alert and oriented to person, place, and time.  Skin: No rash noted. He is not diaphoretic.  Nursing note and vitals reviewed.    UC Treatments / Results  Labs (all labs ordered are listed, but only abnormal results are displayed) Labs Reviewed - No data to display  EKG  EKG Interpretation None       Radiology No results found.  Procedures .EKG Date/Time: 04/12/2016 4:55 PM Performed by: Payton MccallumONTY, Reiana Poteet Authorized by: Payton MccallumONTY, Jovian Lembcke   Previous ECG:    Previous ECG:  Compared to current   Similarity:  Changes noted Interpretation:    Interpretation: abnormal   Rate:    ECG rate assessment: normal   Rhythm:    Rhythm: sinus rhythm   Ectopy:    Ectopy: none   QRS:    QRS axis:  Normal   QRS intervals:  Normal ST segments:    ST segments:  Abnormal   Elevation:  V3, V4 and V5 T waves:    T waves: inverted   Other findings:    Other findings: poor R wave progression     (including critical care time)  Medications  Ordered in UC Medications - No data to display   Initial Impression / Assessment and Plan / UC Course  I have reviewed the triage vital signs and the nursing notes.  Pertinent labs & imaging results that were available during my care of the patient were reviewed by me and considered in my medical decision making (see chart for details).  Clinical Course      Final Clinical Impressions(s) / UC Diagnoses   Final diagnoses:  Other chest pain  Pleuritic chest pain    New Prescriptions Discharge Medication List as of 04/12/2016  4:37 PM     1. ekg results and diagnosis reviewed with patient; due to patient's reported history of possible pericarditis in the past and current symptoms with changes in his ekg, would recommend patient go to ED for further evaluation and management; patient refuses transport by EMS and states [facility]; [facility]   Payton Mccallumrlando Mansel Strother, MD 04/12/16 1701

## 2016-04-12 NOTE — ED Triage Notes (Signed)
Pt presents to ED with reports of central chest pain that radiates to left arm and right arm. Pt reports some dizziness, nausea and shortness of breath. Pt reports he has had this problem before and been evaluated but has no known cause. Pt seen today at [facility] and told to report to ED if symptoms did not resolve.

## 2016-04-12 NOTE — ED Triage Notes (Signed)
Patient states that he has been having chest pain x3 weeks. Patient states that pain is in upper left part of chest. Patient states that recently the pain has been worsening. Patient states that he has noticed some shortness of breath. Patient states that pain is constant.

## 2016-04-12 NOTE — Discharge Instructions (Signed)

## 2018-03-20 IMAGING — CR DG CHEST 2V
1 series · 2 of 2 positions shown · non-contrast
Comparison: None.

CLINICAL DATA: Initial valuation for acute chest pain.

EXAM:
CHEST  2 VIEW

[Series 1: w chest pa · 0.14mm/px · 2 of 2 slices shown]
[im 1/2]
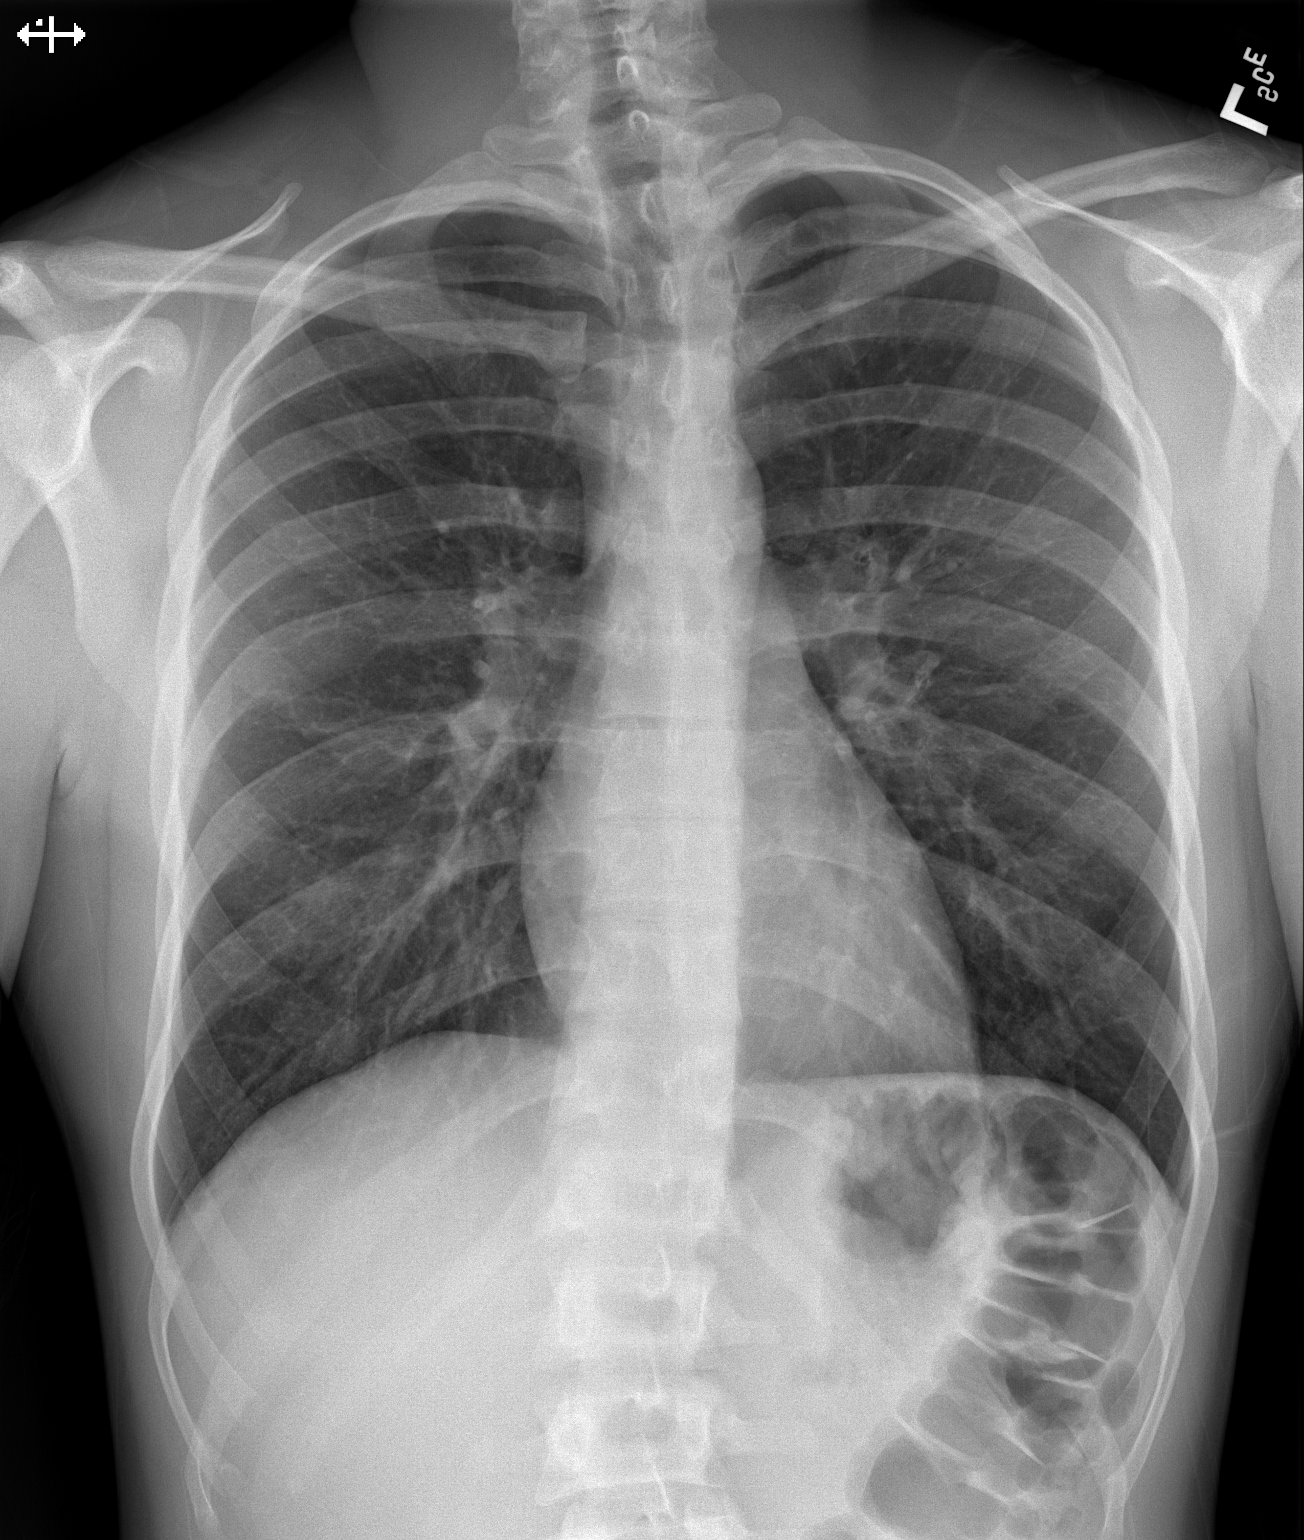
[im 2/2]
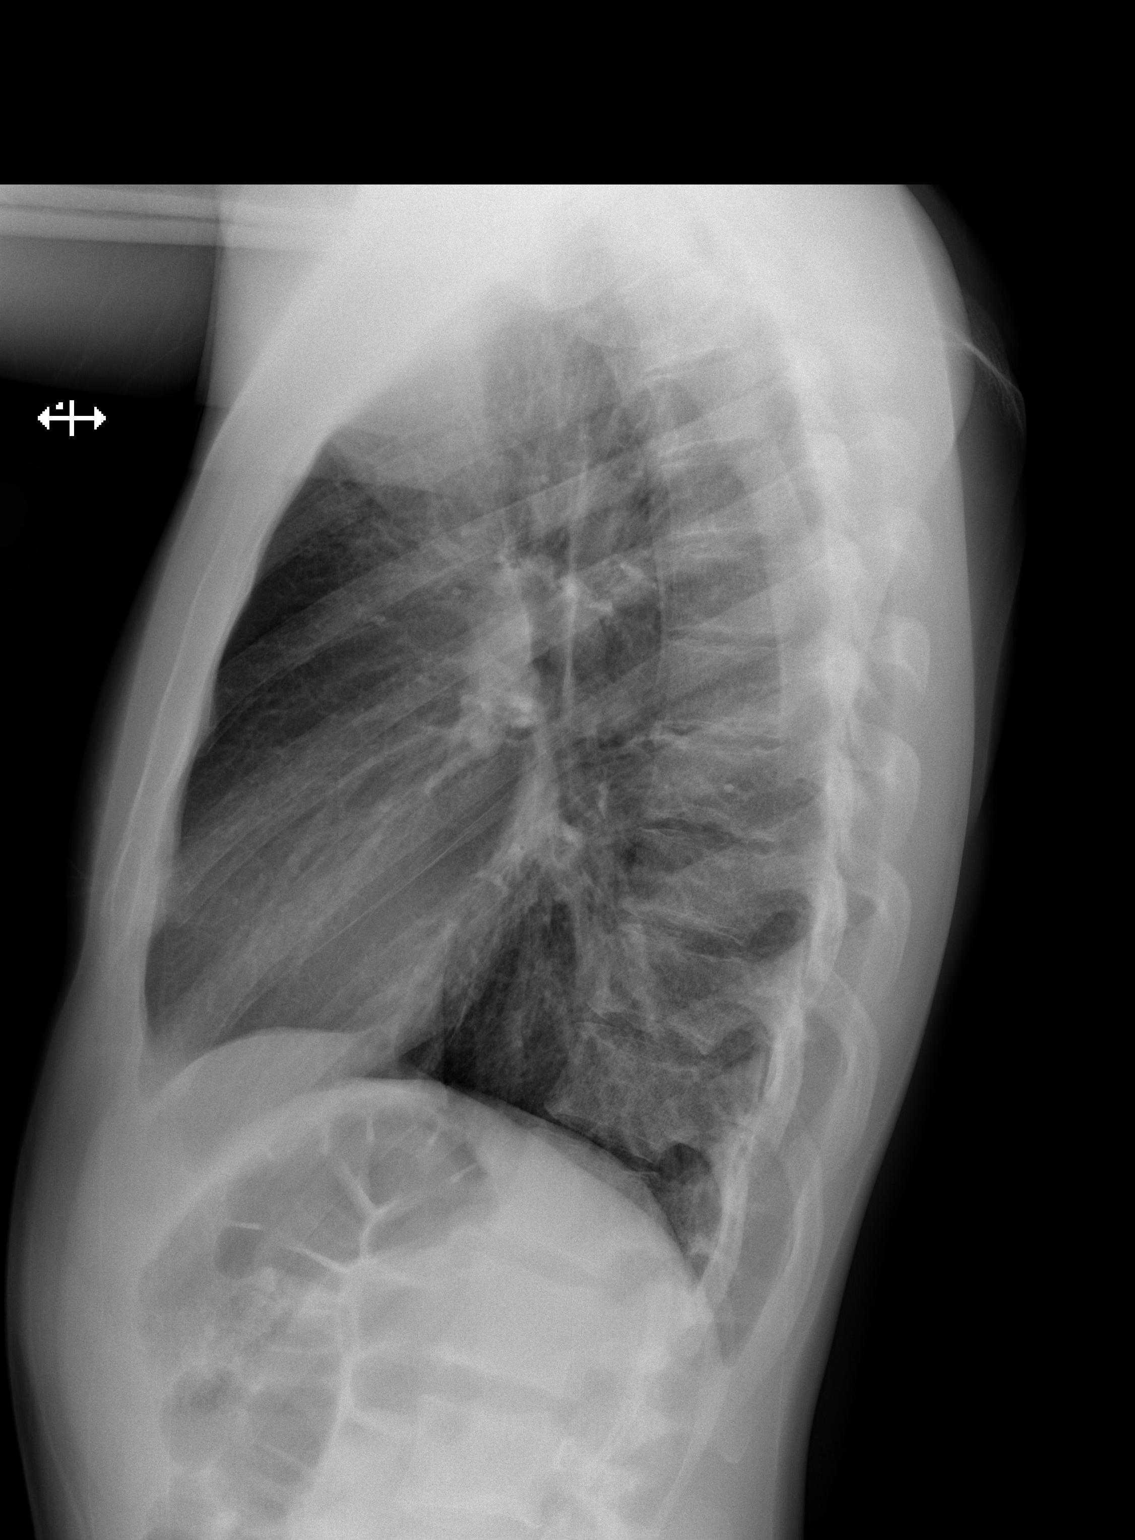

[2 of 2 positions shown; findings below may reference images not displayed]

FINDINGS: The heart size and mediastinal contours are within normal limits.
Both lungs are clear. The visualized skeletal structures are
unremarkable.
IMPRESSION: No active cardiopulmonary disease.

## 2018-04-30 ENCOUNTER — Ambulatory Visit (HOSPITAL_COMMUNITY)
Admission: EM | Admit: 2018-04-30 | Discharge: 2018-04-30 | Disposition: A | Discharge status: Home / Self Care | Payer: Self-pay | Attending: Family Medicine | Admitting: Family Medicine

## 2018-04-30 ENCOUNTER — Encounter (HOSPITAL_COMMUNITY): Payer: Self-pay | Admitting: Emergency Medicine

## 2018-04-30 DIAGNOSIS — M25512 Pain in left shoulder: Secondary | ICD-10-CM

## 2018-04-30 DIAGNOSIS — S161XXA Strain of muscle, fascia and tendon at neck level, initial encounter: Secondary | ICD-10-CM

## 2018-04-30 MED ORDER — CYCLOBENZAPRINE HCL 10 MG PO TABS
10.0000 mg | ORAL_TABLET | Freq: Every evening | ORAL | 0 refills | Status: AC | PRN
Start: 2018-04-30 — End: ?

## 2018-04-30 MED ORDER — DICLOFENAC SODIUM 75 MG PO TBEC: 75.0000 mg | DELAYED_RELEASE_TABLET | Freq: Two times a day (BID) | ORAL | 0 refills | Status: AC
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
